# Patient Record
Sex: Female | Born: 1935 | Race: White | Hispanic: No
Health system: Southern US, Community
[De-identification: ages and names within clinical notes are randomized; demographics above are authoritative.]

---

## 2004-01-13 ENCOUNTER — Ambulatory Visit: Payer: Self-pay | Admitting: Family Medicine

## 2004-03-07 ENCOUNTER — Ambulatory Visit: Payer: Self-pay | Admitting: Family Medicine

## 2004-03-14 ENCOUNTER — Ambulatory Visit: Payer: Self-pay | Admitting: Family Medicine

## 2004-05-04 ENCOUNTER — Ambulatory Visit: Payer: Self-pay | Admitting: Family Medicine

## 2004-05-26 ENCOUNTER — Emergency Department: Payer: Self-pay | Admitting: Emergency Medicine

## 2005-02-04 ENCOUNTER — Emergency Department: Payer: Self-pay | Admitting: Emergency Medicine

## 2006-02-05 ENCOUNTER — Other Ambulatory Visit: Payer: Self-pay

## 2006-02-05 ENCOUNTER — Inpatient Hospital Stay: Payer: Self-pay | Admitting: Specialist

## 2006-02-13 ENCOUNTER — Emergency Department: Payer: Self-pay | Admitting: Emergency Medicine

## 2006-04-13 ENCOUNTER — Ambulatory Visit: Payer: Self-pay | Admitting: Family Medicine

## 2006-04-20 ENCOUNTER — Ambulatory Visit: Payer: Self-pay | Admitting: Family Medicine

## 2006-04-26 ENCOUNTER — Ambulatory Visit: Payer: Self-pay | Admitting: Family Medicine

## 2006-05-03 ENCOUNTER — Ambulatory Visit: Payer: Self-pay | Admitting: Family Medicine

## 2006-05-18 ENCOUNTER — Ambulatory Visit: Payer: Self-pay | Admitting: Family Medicine

## 2006-05-18 LAB — CONVERTED CEMR LAB
ALT: 12 units/L (ref 0–35)
AST: 17 units/L (ref 0–37)
Basophils Absolute: 0.1 10*3/uL (ref 0.0–0.1)
CO2: 23 meq/L (ref 19–32)
Chloride: 103 meq/L (ref 96–112)
Glucose, Bld: 100 mg/dL — ABNORMAL HIGH (ref 70–99)
HCT: 41 % (ref 36.0–46.0)
Lymphocytes Relative: 44 % (ref 12–46)
Lymphs Abs: 4 10*3/uL — ABNORMAL HIGH (ref 0.7–3.3)
Neutro Abs: 3.9 10*3/uL (ref 1.7–7.7)
Neutrophils Relative %: 43 % (ref 43–77)
Platelets: 280 10*3/uL (ref 150–400)
Potassium: 4.2 meq/L (ref 3.5–5.3)
RDW: 14.6 % — ABNORMAL HIGH (ref 11.5–14.0)
Sodium: 140 meq/L (ref 135–145)
TSH: 1.653 microintl units/mL (ref 0.350–5.50)
WBC: 9.2 10*3/uL (ref 4.0–10.5)

## 2006-06-14 ENCOUNTER — Ambulatory Visit: Payer: Self-pay | Admitting: Family Medicine

## 2006-06-14 DIAGNOSIS — I82409 Acute embolism and thrombosis of unspecified deep veins of unspecified lower extremity: Secondary | ICD-10-CM | POA: Insufficient documentation

## 2006-06-14 LAB — CONVERTED CEMR LAB: INR: 2.8

## 2006-07-12 ENCOUNTER — Ambulatory Visit: Payer: Self-pay | Admitting: Family Medicine

## 2006-08-09 ENCOUNTER — Ambulatory Visit: Payer: Self-pay | Admitting: Family Medicine

## 2006-08-09 LAB — CONVERTED CEMR LAB: Prothrombin Time: 13.4 s

## 2006-08-16 ENCOUNTER — Ambulatory Visit: Payer: Self-pay | Admitting: Family Medicine

## 2006-08-16 LAB — CONVERTED CEMR LAB: INR: 1.4

## 2006-08-31 ENCOUNTER — Ambulatory Visit: Payer: Self-pay | Admitting: Family Medicine

## 2006-08-31 LAB — CONVERTED CEMR LAB
INR: 3.7
Prothrombin Time: 23.2 s

## 2006-09-14 ENCOUNTER — Ambulatory Visit: Payer: Self-pay | Admitting: Family Medicine

## 2006-09-14 LAB — CONVERTED CEMR LAB: Prothrombin Time: 24 s

## 2006-09-28 ENCOUNTER — Ambulatory Visit: Payer: Self-pay | Admitting: Family Medicine

## 2006-09-28 LAB — CONVERTED CEMR LAB: Prothrombin Time: 24.6 s

## 2006-10-12 ENCOUNTER — Ambulatory Visit: Payer: Self-pay | Admitting: Family Medicine

## 2006-10-12 LAB — CONVERTED CEMR LAB: INR: 3.9

## 2006-11-01 ENCOUNTER — Ambulatory Visit: Payer: Self-pay | Admitting: Internal Medicine

## 2006-11-01 LAB — CONVERTED CEMR LAB: Prothrombin Time: 18.7 s

## 2006-11-29 ENCOUNTER — Ambulatory Visit: Payer: Self-pay | Admitting: Family Medicine

## 2006-11-29 LAB — CONVERTED CEMR LAB
INR: 1.2
Prothrombin Time: 13.7 s

## 2007-01-07 ENCOUNTER — Ambulatory Visit: Payer: Self-pay | Admitting: Family Medicine

## 2007-01-07 LAB — CONVERTED CEMR LAB: Prothrombin Time: 21.6 s

## 2007-02-22 ENCOUNTER — Ambulatory Visit: Payer: Self-pay | Admitting: Family Medicine

## 2007-02-22 LAB — CONVERTED CEMR LAB
INR: 2.8
Prothrombin Time: 20.2 s

## 2007-04-19 ENCOUNTER — Ambulatory Visit: Payer: Self-pay | Admitting: Family Medicine

## 2007-04-19 LAB — CONVERTED CEMR LAB: Prothrombin Time: 33.8 s

## 2007-04-20 LAB — CONVERTED CEMR LAB: Prothrombin Time: 52.2 s — ABNORMAL HIGH (ref 11.6–15.2)

## 2007-05-17 ENCOUNTER — Encounter (INDEPENDENT_AMBULATORY_CARE_PROVIDER_SITE_OTHER): Payer: Self-pay | Admitting: *Deleted

## 2007-06-07 ENCOUNTER — Encounter (INDEPENDENT_AMBULATORY_CARE_PROVIDER_SITE_OTHER): Payer: Self-pay | Admitting: *Deleted

## 2008-07-30 ENCOUNTER — Telehealth: Payer: Self-pay | Admitting: Family Medicine

## 2010-09-20 ENCOUNTER — Ambulatory Visit: Payer: Self-pay | Admitting: Ophthalmology

## 2010-10-04 ENCOUNTER — Ambulatory Visit: Payer: Self-pay | Admitting: Ophthalmology

## 2011-05-22 ENCOUNTER — Emergency Department: Payer: Self-pay | Admitting: Emergency Medicine

## 2011-10-11 ENCOUNTER — Ambulatory Visit: Payer: Self-pay | Admitting: Ophthalmology

## 2011-10-24 ENCOUNTER — Ambulatory Visit: Payer: Self-pay | Admitting: Ophthalmology

## 2011-10-29 ENCOUNTER — Inpatient Hospital Stay: Payer: Self-pay | Admitting: Internal Medicine

## 2011-10-29 LAB — CBC
HCT: 39.1 % (ref 35.0–47.0)
MCH: 30.9 pg (ref 26.0–34.0)
MCHC: 35.3 g/dL (ref 32.0–36.0)
MCV: 87 fL (ref 80–100)
Platelet: 273 10*3/uL (ref 150–440)
RDW: 13.8 % (ref 11.5–14.5)
WBC: 4.6 10*3/uL (ref 3.6–11.0)

## 2011-10-29 LAB — URINALYSIS, COMPLETE
Bacteria: NONE SEEN
Bilirubin,UR: NEGATIVE
Glucose,UR: NEGATIVE mg/dL (ref 0–75)
Protein: NEGATIVE
RBC,UR: 1 /HPF (ref 0–5)
Squamous Epithelial: NONE SEEN
WBC UR: NONE SEEN /HPF (ref 0–5)

## 2011-10-29 LAB — COMPREHENSIVE METABOLIC PANEL
BUN: 17 mg/dL (ref 7–18)
Bilirubin,Total: 1 mg/dL (ref 0.2–1.0)
Chloride: 94 mmol/L — ABNORMAL LOW (ref 98–107)
Co2: 25 mmol/L (ref 21–32)
Creatinine: 0.65 mg/dL (ref 0.60–1.30)
EGFR (Non-African Amer.): >60
Potassium: 3.6 mmol/L (ref 3.5–5.1)
SGPT (ALT): 12 U/L (ref 12–78)
Sodium: 128 mmol/L — ABNORMAL LOW (ref 136–145)
Total Protein: 6.8 g/dL (ref 6.4–8.2)

## 2011-10-29 LAB — MAGNESIUM: Magnesium: 1.4 mg/dL — ABNORMAL LOW

## 2011-10-29 LAB — CK TOTAL AND CKMB (NOT AT ARMC)
CK, Total: 33 U/L (ref 21–215)
CK-MB: 2.7 ng/mL (ref 0.5–3.6)
CK-MB: 1.6 ng/mL (ref 0.5–3.6)

## 2011-10-29 LAB — PROTIME-INR
INR: 1
Prothrombin Time: 13.3 secs (ref 11.5–14.7)

## 2011-10-29 LAB — TSH: Thyroid Stimulating Horm: 0.92 u[IU]/mL

## 2011-10-30 LAB — CBC WITH DIFFERENTIAL/PLATELET
Basophil #: 0 10*3/uL (ref 0.0–0.1)
Eosinophil #: 0 10*3/uL (ref 0.0–0.7)
Eosinophil %: 0.1 %
Lymphocyte #: 0.8 10*3/uL — ABNORMAL LOW (ref 1.0–3.6)
Lymphocyte %: 14.3 %
MCH: 30.5 pg (ref 26.0–34.0)
MCV: 88 fL (ref 80–100)
Monocyte #: 0.2 x10 3/mm (ref 0.2–0.9)
Monocyte %: 4.2 %
Neutrophil %: 81.2 %
Platelet: 240 10*3/uL (ref 150–440)
RBC: 4.16 10*6/uL (ref 3.80–5.20)
RDW: 13.7 % (ref 11.5–14.5)

## 2011-10-30 LAB — TROPONIN I: Troponin-I: 0.06 ng/mL — ABNORMAL HIGH

## 2011-10-30 LAB — BASIC METABOLIC PANEL
Anion Gap: 8 (ref 7–16)
Calcium, Total: 7.7 mg/dL — ABNORMAL LOW (ref 8.5–10.1)
Creatinine: 0.66 mg/dL (ref 0.60–1.30)
EGFR (Non-African Amer.): >60
Glucose: 120 mg/dL — ABNORMAL HIGH (ref 65–99)
Osmolality: 262 (ref 275–301)
Potassium: 4.6 mmol/L (ref 3.5–5.1)

## 2011-10-30 LAB — CK TOTAL AND CKMB (NOT AT ARMC)
CK, Total: 33 U/L (ref 21–215)
CK-MB: 1.5 ng/mL (ref 0.5–3.6)

## 2011-10-31 LAB — BASIC METABOLIC PANEL
Anion Gap: 10 (ref 7–16)
Calcium, Total: 7.8 mg/dL — ABNORMAL LOW (ref 8.5–10.1)
Chloride: 98 mmol/L (ref 98–107)
Co2: 20 mmol/L — ABNORMAL LOW (ref 21–32)
Osmolality: 261 (ref 275–301)
Potassium: 4.3 mmol/L (ref 3.5–5.1)
Sodium: 128 mmol/L — ABNORMAL LOW (ref 136–145)

## 2011-10-31 LAB — CBC WITH DIFFERENTIAL/PLATELET
Eosinophil #: 0 10*3/uL (ref 0.0–0.7)
Eosinophil %: 0.1 %
HCT: 35.1 % (ref 35.0–47.0)
HGB: 12.3 g/dL (ref 12.0–16.0)
Lymphocyte #: 0.6 10*3/uL — ABNORMAL LOW (ref 1.0–3.6)
Lymphocyte %: 4.7 %
MCV: 89 fL (ref 80–100)
Monocyte %: 2.5 %
Neutrophil #: 11.7 10*3/uL — ABNORMAL HIGH (ref 1.4–6.5)
RBC: 3.97 10*6/uL (ref 3.80–5.20)
WBC: 12.7 10*3/uL — ABNORMAL HIGH (ref 3.6–11.0)

## 2011-11-01 ENCOUNTER — Ambulatory Visit: Payer: Self-pay | Admitting: Internal Medicine

## 2011-11-01 LAB — CBC WITH DIFFERENTIAL/PLATELET
Basophil %: 0.1 %
Eosinophil #: 0 10*3/uL (ref 0.0–0.7)
Eosinophil %: 0.1 %
HCT: 34.6 % — ABNORMAL LOW (ref 35.0–47.0)
Lymphocyte #: 0.8 10*3/uL — ABNORMAL LOW (ref 1.0–3.6)
MCV: 89 fL (ref 80–100)
Monocyte %: 4.6 %
Neutrophil #: 11.6 10*3/uL — ABNORMAL HIGH (ref 1.4–6.5)
Neutrophil %: 89.4 %
RBC: 3.9 10*6/uL (ref 3.80–5.20)
WBC: 13 10*3/uL — ABNORMAL HIGH (ref 3.6–11.0)

## 2011-11-01 LAB — BASIC METABOLIC PANEL
Anion Gap: 13 (ref 7–16)
BUN: 24 mg/dL — ABNORMAL HIGH (ref 7–18)
Calcium, Total: 7.7 mg/dL — ABNORMAL LOW (ref 8.5–10.1)
Co2: 17 mmol/L — ABNORMAL LOW (ref 21–32)
Creatinine: 0.48 mg/dL — ABNORMAL LOW (ref 0.60–1.30)
EGFR (African American): >60
EGFR (Non-African Amer.): >60
Potassium: 4 mmol/L (ref 3.5–5.1)
Sodium: 135 mmol/L — ABNORMAL LOW (ref 136–145)

## 2011-11-14 ENCOUNTER — Ambulatory Visit: Payer: Self-pay | Admitting: Internal Medicine

## 2011-11-14 DEATH — deceased

## 2014-01-07 IMAGING — CT CT ABD-PELV W/ CM
1 of 2 series · 14 of 32 positions shown, 18 images · IV contrast (isovue)
Comparison: none

REASON FOR EXAM: (1) diffuse abdominal pain, elevated lactic acid, no PO
contrast please; (2) dif
COMMENTS:

PROCEDURE:     CT  - CT ABDOMEN / PELVIS  W  - October 29, 2011 [DATE]
RESULT:     Comparison:  None
TECHNIQUE: Multiple axial images of the abdomen and pelvis were performed
from the lung bases to the pubic symphysis, without p.o. contrast and with
100 mL of Isovue 300 intravenous contrast.

[Series 2: soft tissue · axial · 0.75mm/px · z∈[-432,-42]mm · 14 of 142 slices shown, 18 images]
[im 6/142  soft-tissue]
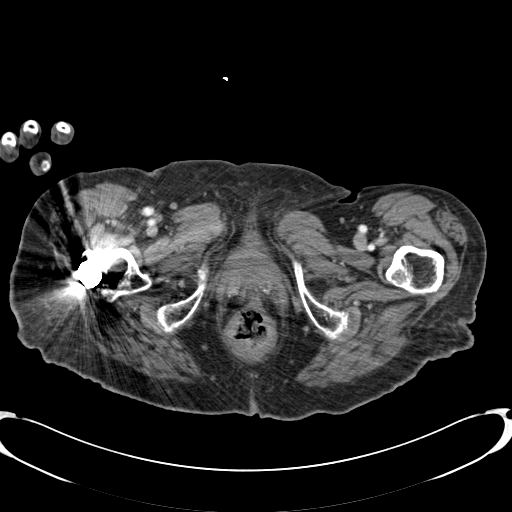
[im 6/142  bone]
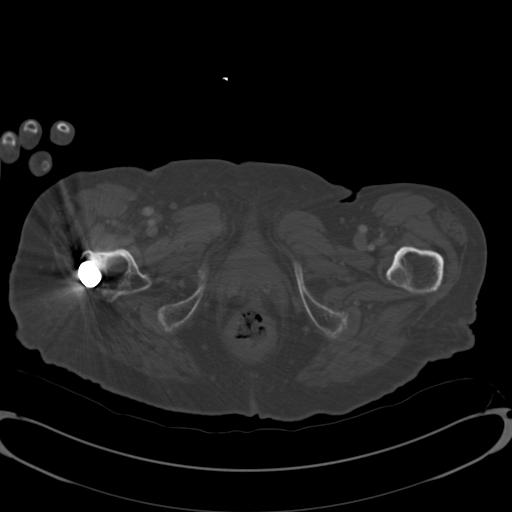
[im 18/142  soft-tissue]
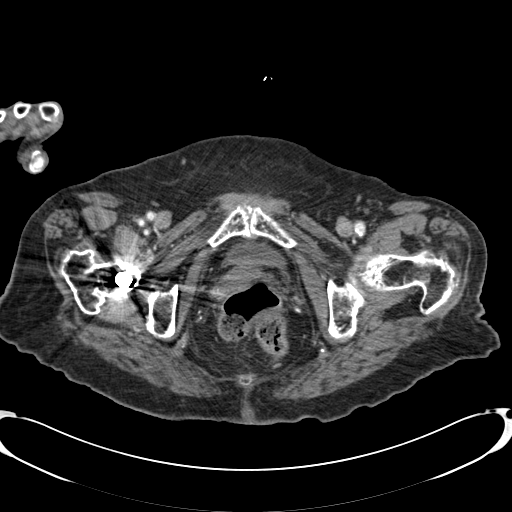
[im 30/142  soft-tissue]
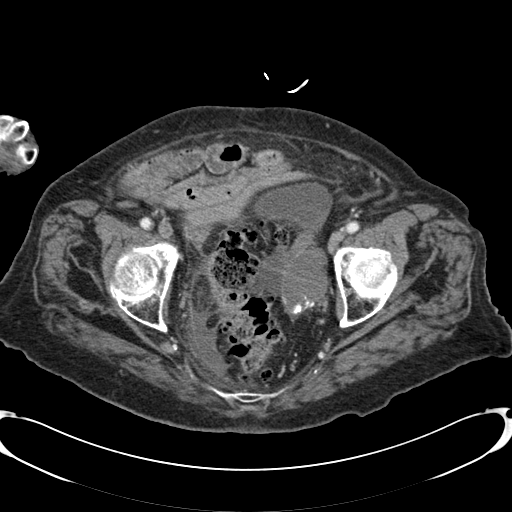
[im 42/142  soft-tissue]
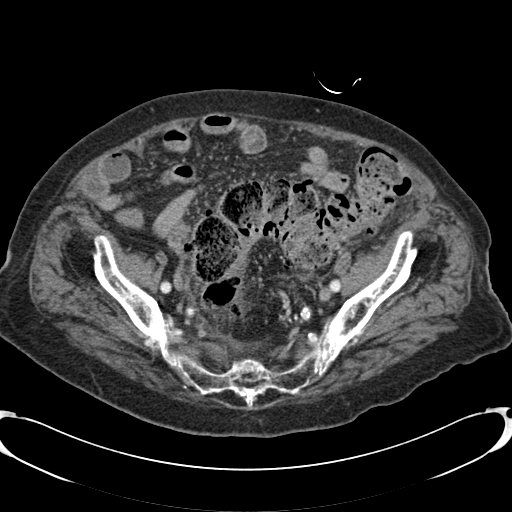
[im 53/142  soft-tissue]
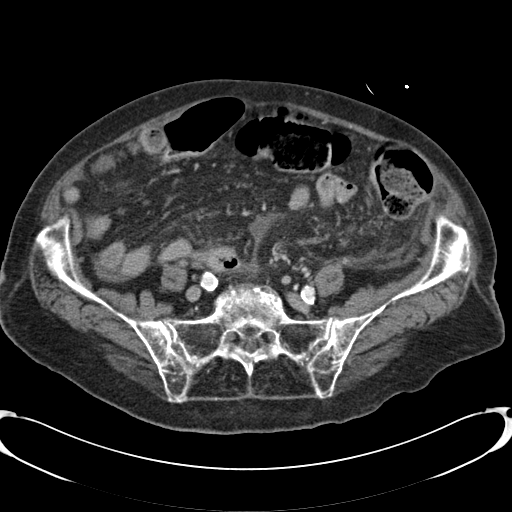
[im 65/142  soft-tissue]
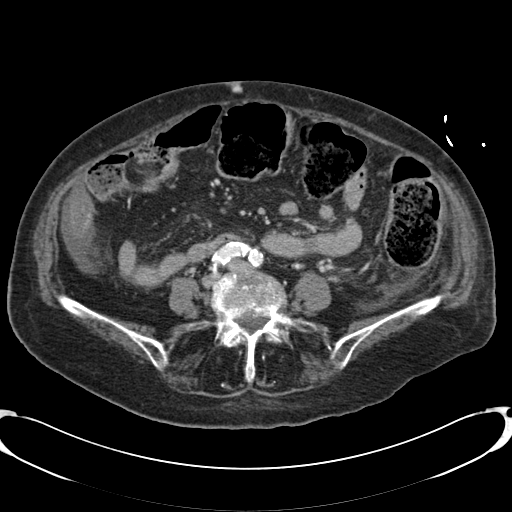
[im 77/142  soft-tissue]
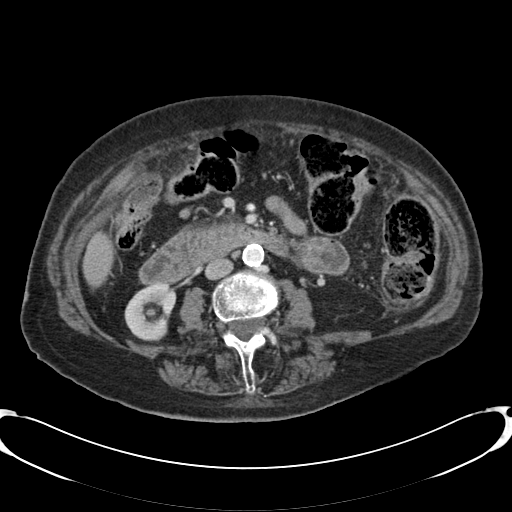
[im 89/142  soft-tissue]
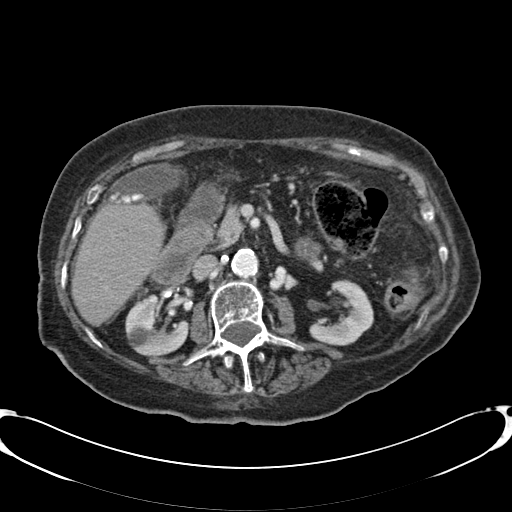
[im 100/142  soft-tissue]
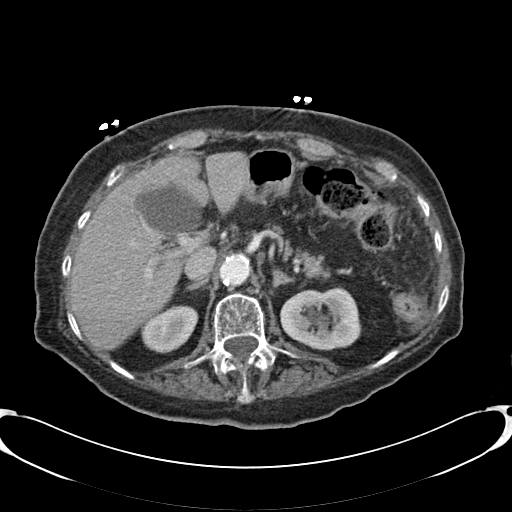
[im 100/142  bone]
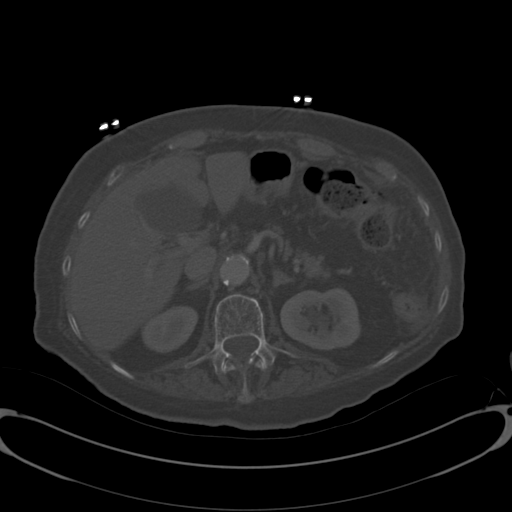
[im 112/142  soft-tissue]
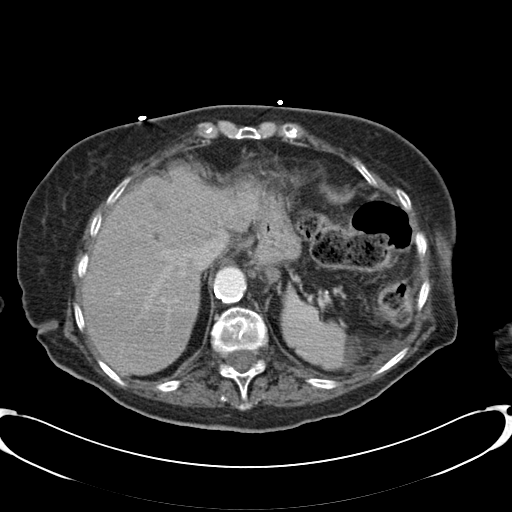
[im 118/142  lung]
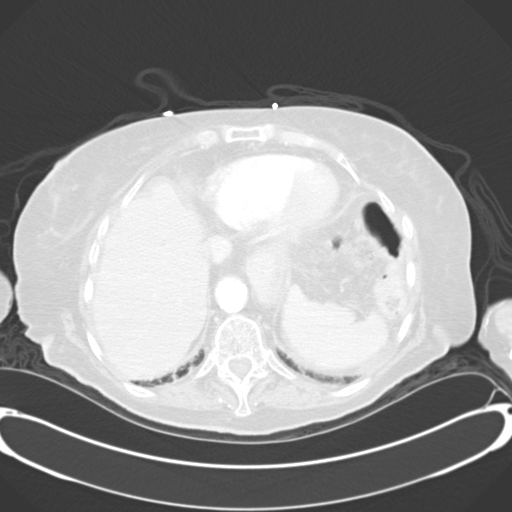
[im 124/142  soft-tissue]
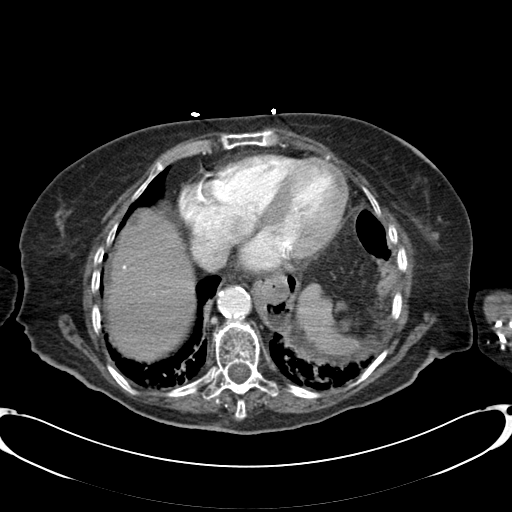
[im 124/142  lung]
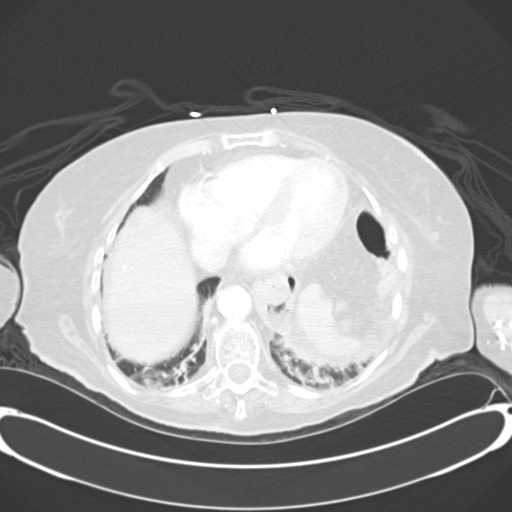
[im 130/142  lung]
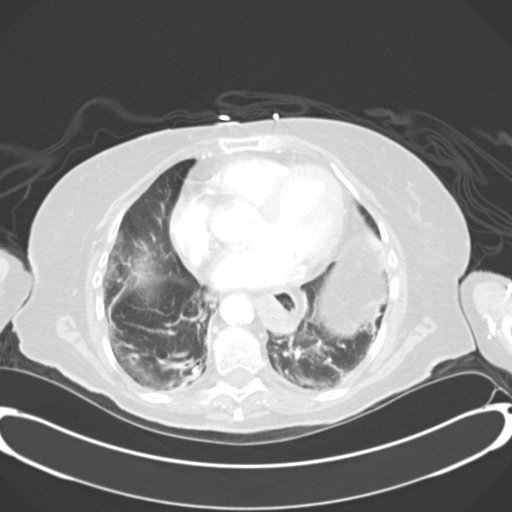
[im 136/142  soft-tissue]
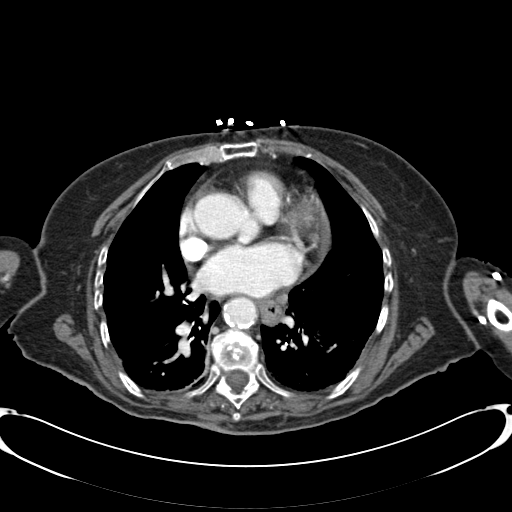
[im 136/142  lung]
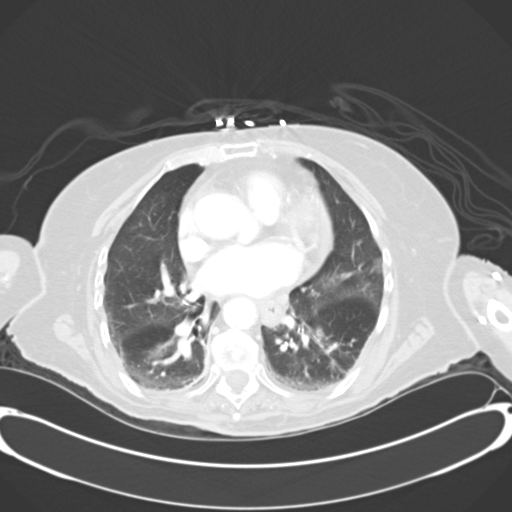

[14 of 32 positions shown; findings below may reference images not displayed]

FINDINGS: Mild basilar opacities are likely secondary to atelectasis. There is a
moderate sized hiatal hernia. Calcifications are seen in the coronary
arteries.

Low-attenuation mass in the liver is consistent with a cyst. Small
calcifications in the liver are likely sequela of old prior infection. There
is mild central intrahepatic biliary ductal dilatation, which is
nonspecific. The left hepatic lobe is somewhat atrophic. There is a small
low-attenuation lesion in the left hepatic lobe which is too small to
characterize. Multiple calcified stones are present within the gallbladder.
The spleen, adrenals, and pancreas are unremarkable. There is an 8mm
calcification medial to the right hilum which may represent a calcified
renal artery aneurysm. There is a cyst in the right kidney. Other small
low-attenuation lesion in the right kidney is too small to characterize. No
hydronephrosis.

The small and large bowel are normal in caliber. The appendix is not
visualized. However, there are no inflammatory changes in the right lower
quadrant. There is a small amount of free fluid and stranding throughout the
abdomen and pelvis. There is mild diverticulosis of the sigmoid and
descending colon. Stool is seen in the sigmoid colon. Mild apparent wall
thickening of the superior descending colon is likely related to
underdistention. However, there is some adjacent free fluid. Correlate for
colitis. There is a low-attenuation mass in the left adnexa with
calcification the posterior aspect of the mass. It measures 5.7 x 3.7 cm.
This may be ovarian.

There is an old fracture of the right inferior pubic ramus.

No aggressive lytic or sclerotic osseous lesions are identified.
IMPRESSION: 1. There is a mild amount of free fluid and stranding throughout the abdomen
and pelvis. This is nonspecific. There is an area of apparent wall
thickening of the superior descending colon which is likely secondary to
underdistention. However, given the adjacent free fluid, an etiology such as
colitis cannot be excluded. Correlate clinically.
2. There is a moderate sized low-attenuation mass with some calcification in
the left adnexa. This may be ovarian. In a postmenopausal patient,
differential would include benign and malignant cystic ovarian neoplasms.
Nonemergent pelvic ultrasound and gynecology consultation are suggested.
3. Mild central intrahepatic biliary ductal dilatation is nonspecific.
Correlate with laboratory values. Further evaluation could be provided with
M.R.C.P. or ERCP, as indicated.
4. Cholelithiasis.

## 2014-01-08 IMAGING — CT CT ABD-PELV W/O CM
1 of 2 series · 14 of 32 positions shown, 18 images · non-contrast
Comparison: none

REASON FOR EXAM: (1) f/u colitis, exam worsening, [AGE]; (2) same;
NOTE: Nursing to Give Oral
COMMENTS:

PROCEDURE:     CT  - CT ABDOMEN AND PELVIS W[DATE]  [DATE]
RESULT:     CT abdomen pelvis dated 10/30/2011. Comparison made to prior
study dated 10/29/2011.
TECHNIQUE: Helical non IV contrasted 3 mm sections were obtained from the
lung bases through the pubic symphysis. The patient received oral contrast.

[Series 2: soft tissue · axial · 0.81mm/px · z∈[-268,+146]mm · 14 of 152 slices shown, 18 images]
[im 7/152  soft-tissue]
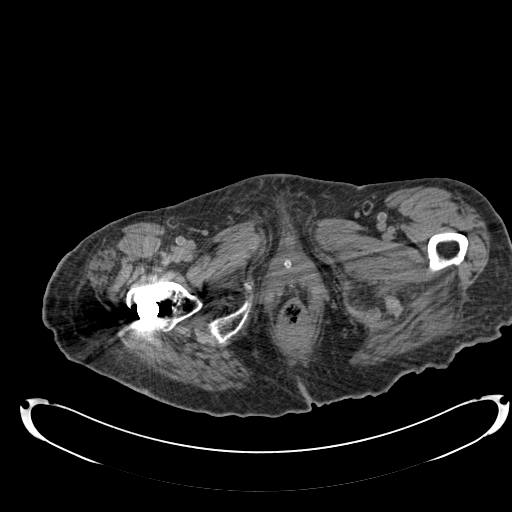
[im 7/152  bone]
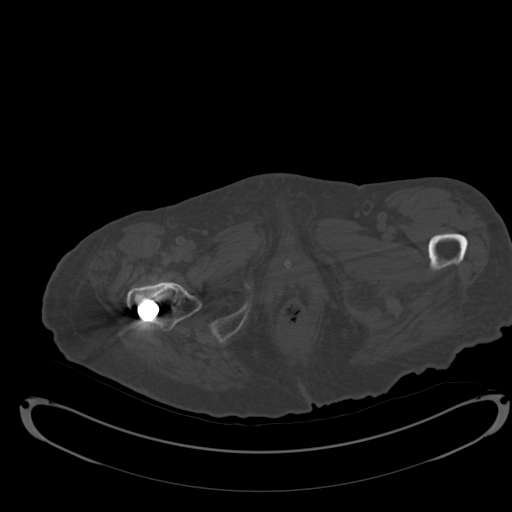
[im 19/152  soft-tissue]
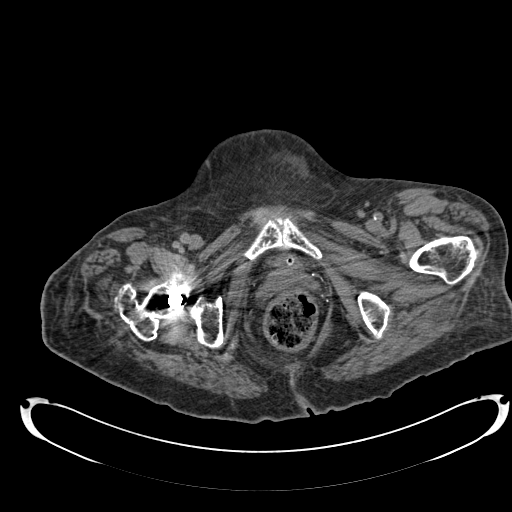
[im 32/152  soft-tissue]
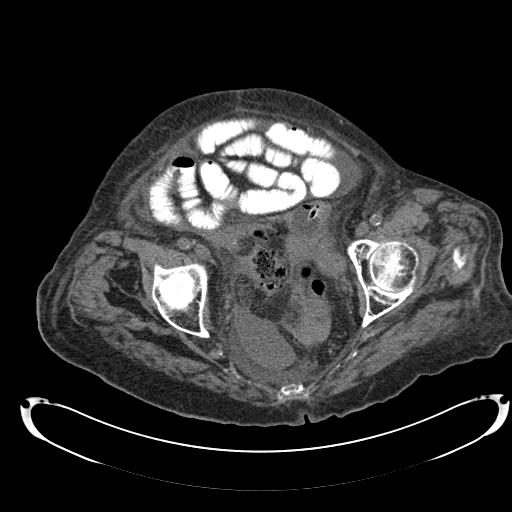
[im 45/152  soft-tissue]
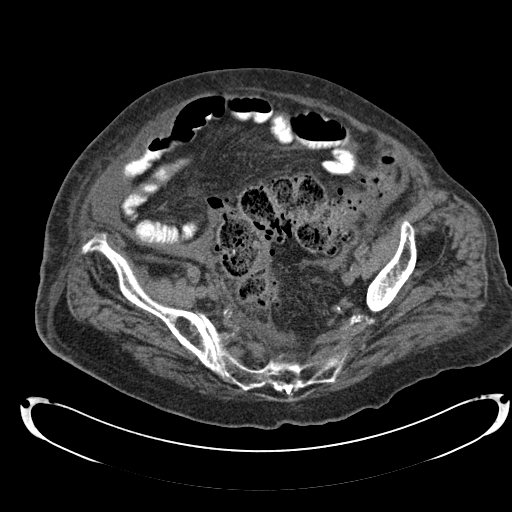
[im 57/152  soft-tissue]
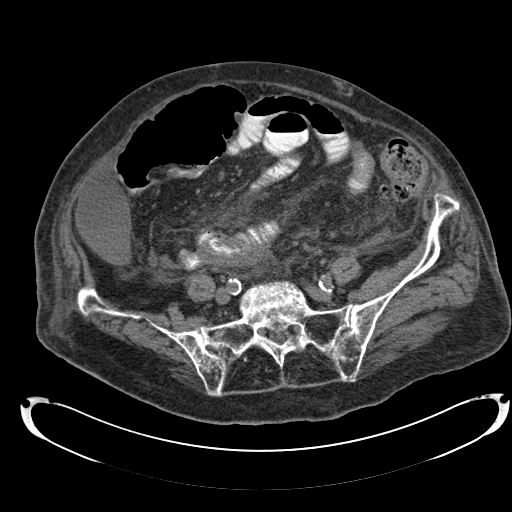
[im 70/152  soft-tissue]
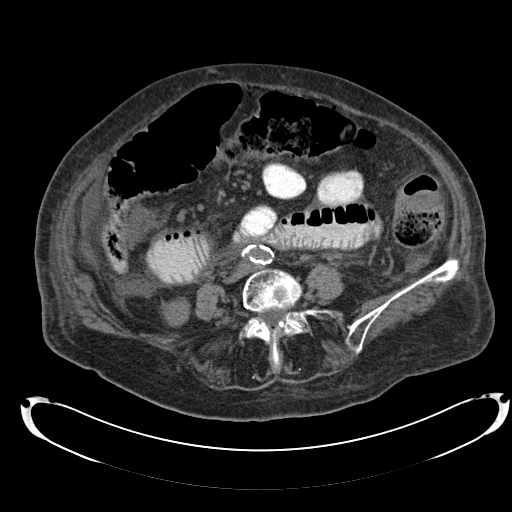
[im 82/152  soft-tissue]
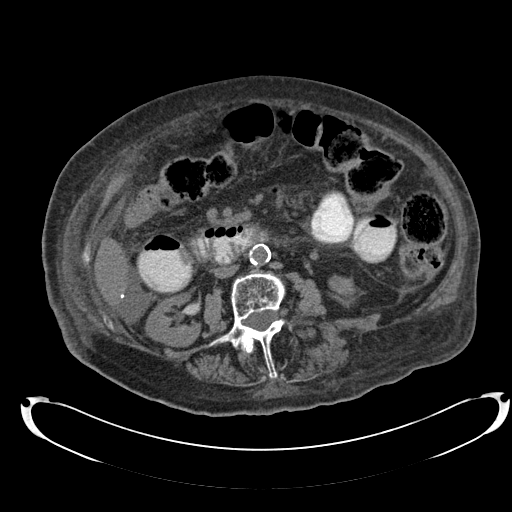
[im 95/152  soft-tissue]
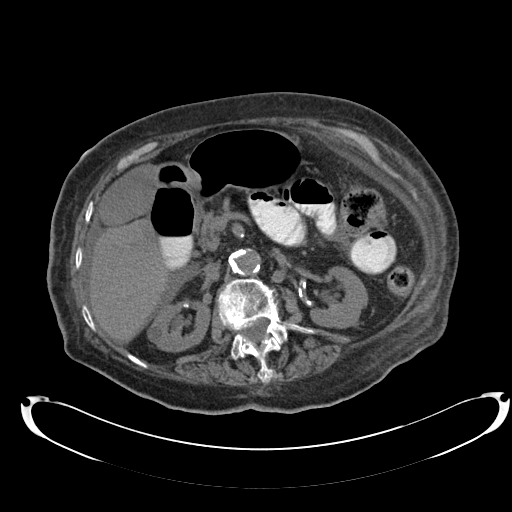
[im 107/152  soft-tissue]
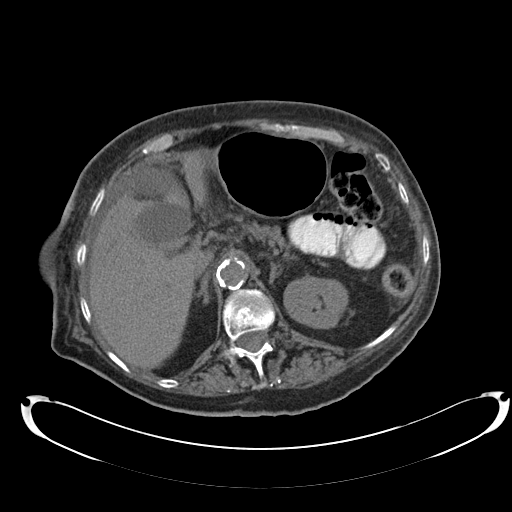
[im 107/152  bone]
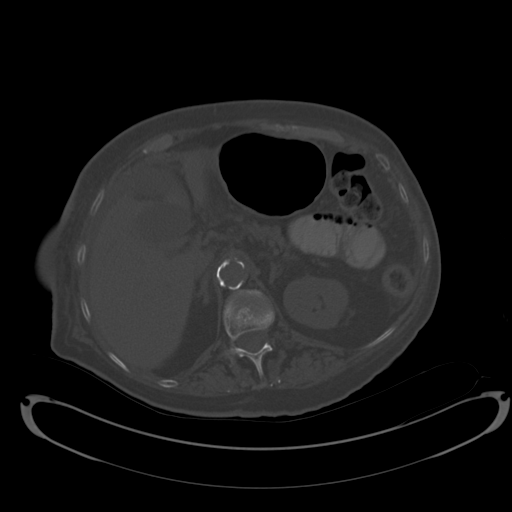
[im 120/152  soft-tissue]
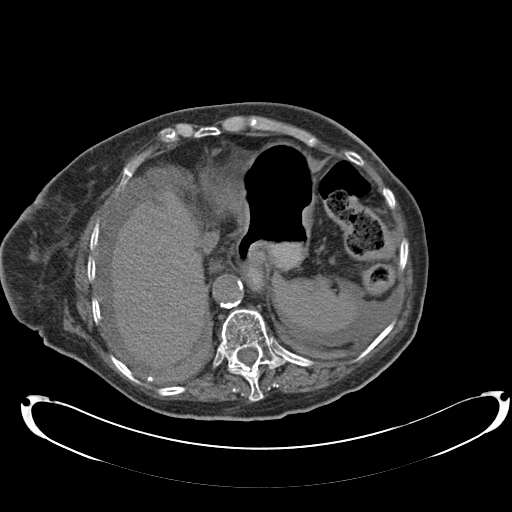
[im 126/152  lung]
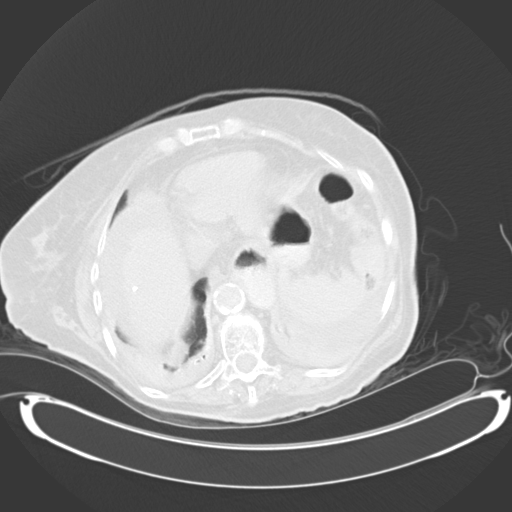
[im 133/152  soft-tissue]
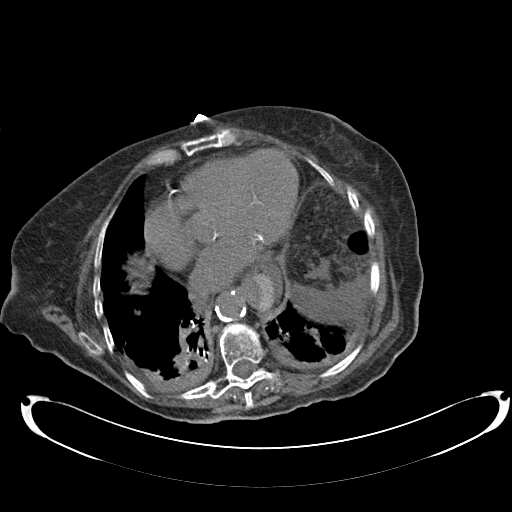
[im 133/152  lung]
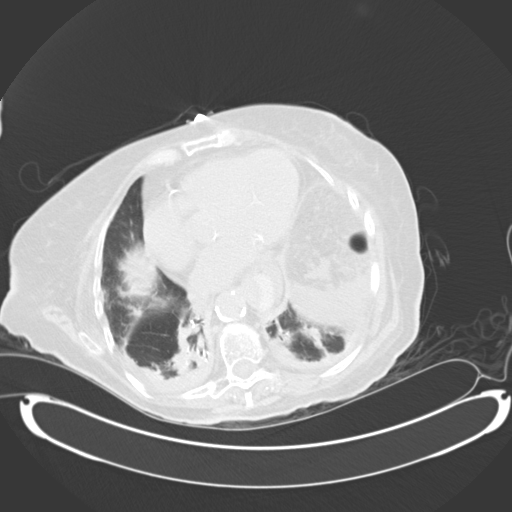
[im 139/152  lung]
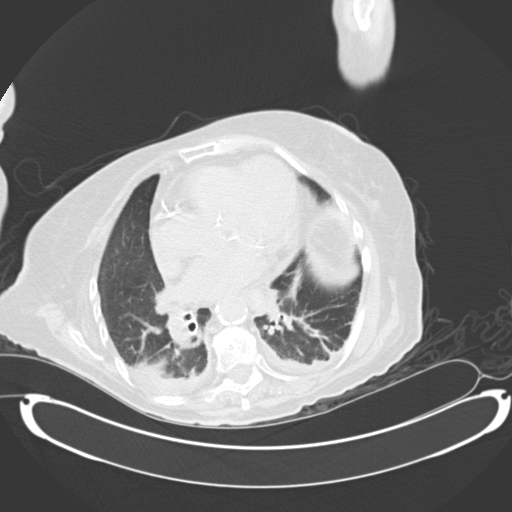
[im 145/152  soft-tissue]
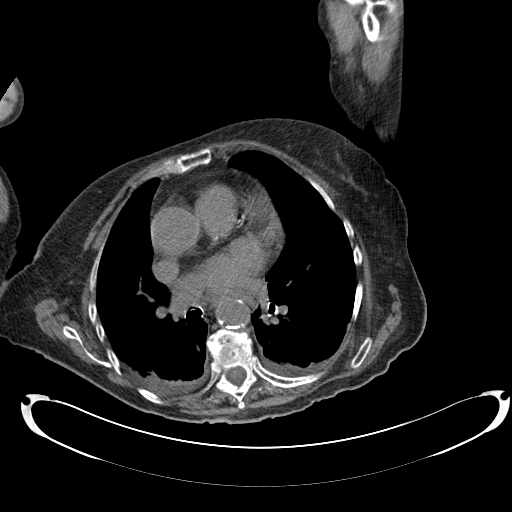
[im 145/152  lung]
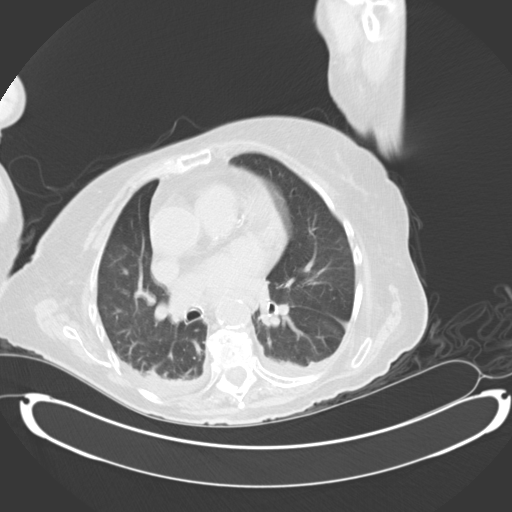

[14 of 32 positions shown; findings below may reference images not displayed]

FINDINGS: Small bilateral pleural effusion identified. Atelectasis versus
infiltrate appreciated within the lung bases, mild.

There has been interval development of a mild amount of perihepatic and
perisplenic ascites. Gallstones once again appreciated within the
gallbladder. A cystic area projects within the gallbladder fossa. This may
reflect the sequela of a choledochocyst. This appears to extend into the
region of the liver parenchyma and neck of the gallbladder.

There has been interval development of multiple dilated loops of small bowel
which contain contrast and air. Small amount of ascites is appreciated
within the lower abdomen and pelvis. Mild to moderately dilated loops of
small bowel are appreciated within the pannus along the anterior lower
abdomen. There is no evidence of drainable loculated fluid collections.
Small foci of pneumoperitoneum identified deep to the anterior abdominal
wall. This finding raises the suspicion of a perforated hollow viscus. These
findings are appreciated on images 84 to 93. This finding has developed in
the interim. Discrete foci of near is identified within the central and
lower portions of the abdomen . Diffuse diverticulosis is appreciated in
within the sigmoid colon and a ruptured diverticulum is also diagnostic
consideration.

A moderate to large amount of stools appreciated within the region of the
sigmoid colon. There is fluid within the pelvis with areas adjacent to the
sigmoid colon. Mild stranding is identified within the mesenteric fat of the
pelvis diverticulitis early or mild particularly within the sigmoid colon is
a diagnostic concern. The area of bowel wall thickening in the proximal
portion of the descending colon is unchanged and a focus of early or mild
colitis is also diagnostic consideration.

The kidneys are atrophic. Low attenuating focus is identified within the
right kidney possibly representing a cyst. Retained contrast identified
within the right renal pelvis. The spleen, adrenals, and pancreas are
unremarkable.
IMPRESSION: 1. Atelectasis versus infiltrate in the lung bases.
2. Small foci of pneumoperitoneum identified. This finding as occurred in
the interim different considerations are ruptured hollow viscus or possibly
a ruptured diverticulum. Free fluid is also appreciated within the abdomen
also occurring in the interim again raising concern of a possible ruptured
hollow viscus.
3. Small bowel ileus versus early or mild partial obstruction
4. Diverticulosis and possibly diverticulitis within the sigmoid colon.
5. Early or mild focus of colitis within the proximal descending colon
6. Moderate to large amount of fecal retention
7. findings which may represent a choledochocyst.
8. Gallstones within the gallbladder

## 2014-06-02 NOTE — Consult Note (Signed)
Chief Complaint:   Subjective/Chief Complaint Dr. Earmon Phoenixooper's and Bird's notes reviewed. Apparently, family has decided comfort care. Agree with current management. Further recommendations per primary and surgery teams.   Electronic Signatures: Lurline DelIftikhar, Doren Kaspar (MD)  (Signed 17-Sep-13 12:03)  Authored: Chief Complaint   Last Updated: 17-Sep-13 12:03 by Lurline DelIftikhar, Domonic Hiscox (MD)

## 2014-06-02 NOTE — Discharge Summary (Signed)
PATIENT NAME:  Tina Orr, Tina Orr MR#:  [id number] DATE OF BIRTH:  1911/02/01  DATE OF ADMISSION:  10/29/2011 DATE OF DISCHARGE:  11/01/2011  DIAGNOSES AT TIME OF DISCHARGE:  1. Abdominal pain with nausea, vomiting, and diarrhea. 2. Perforated abdominal viscus. 3. History of hypertension. 4. Osteoarthritis.   CHIEF COMPLAINT: Abdominal pain, nausea, vomiting, and diarrhea.   HISTORY OF PRESENT ILLNESS: Tina Orr is a 81 year old female who [facility] Emergency [facility] complaining of abdominal pain, nausea, vomiting, and diarrhea. Patient states that her symptoms started approximately two days back and subsequently became weak, was unable to get out of bed. She was noted to be dehydrated and tachycardic. Initial CT scan was suggestive of colitis.   PAST MEDICAL HISTORY: Significant for hypertension.   ALLERGIES: She did not have any known drug allergies.   PHYSICAL EXAMINATION: GENERAL: She was in moderate distress. VITAL SIGNS: Temperature 98.4, pulse 92, respirations 22, blood pressure 122/46. HEENT: Normocephalic, atraumatic. Sclera was anicteric. NECK: Supple. HEART: S1, S2, tachycardic. LUNGS: Clear to auscultation bilaterally. ABDOMEN: Soft, distended. Initially bowel sounds were hyperactive, subsequently, she did not have any bowel sounds. EXTREMITIES: No edema. NEUROLOGIC: Nonfocal.   LABORATORY, DIAGNOSTIC AND RADIOLOGICAL DATA: Glucose 184, BUN 17, creatinine 0.6, sodium 128, potassium 3.6, chloride 94, bicarbonate 25, magnesium 1.4. LFTs were within normal range. Troponin 0.07. TSH 0.9, WBC count 4.6, hemoglobin 13.8, lactic acid was elevated at 2.2, platelet 273.   HOSPITAL COURSE: Patient was admitted to [facility] and received intravenous fluids. She also received Zofran for nausea, vomiting. She was subsequently evaluated by surgeons, Dr. Egbert GaribaldiBird and also Dr. Excell Seltzerooper. She was also seen by GI, Dr. Niel HummerIftikhar. A repeat CT scan was  performed which showed atelectasis versus infiltrate in the lung bases. A small foci of pneumoperitoneum was identified. Considerations involve ruptured hollow viscus, possibly ruptured diverticulum. Free fluid was also appreciated. Small bowel ileus versus early to mild partial obstruction, diverticulosis and possible diverticulitis within the sigmoid colon. Patient was started on intravenous antibiotics. During her stay in the hospital she continued to be symptomatic with pain and consultations were held with family and it was clear that she was not a good surgical candidate with her advanced age and subsequent need for ileostomy and prolonged postoperative course. She was therefore seen by palliative care and was subsequently transferred to hospice home for pain management and comfort care. Family was made aware of her overall poor prognosis and they were in agreement with the plan and course of action. Total time spent in discharging patient and co rodinating care was 45 minutes  ____________________________ Barbette ReichmannVishwanath Icess Bertoni, MD vh:cms D: 05/17/11 13:26:04 ET T: 11/03/2011 11:16:35 ET JOB#: [id number]  cc: Barbette ReichmannVishwanath Matraca Hunkins, MD, <Dictator> Barbette ReichmannVISHWANATH Debar Plate MD ELECTRONICALLY SIGNED 11/06/2011 13:08

## 2014-06-02 NOTE — Consult Note (Signed)
PATIENT NAME:  Tina Orr, [name] MR#:  [id number] DATE OF BIRTH:  1910-05-17  DATE OF CONSULTATION:  10/30/2011  REFERRING PHYSICIAN:  Barbette ReichmannVishwanath Hande, MD / Lurline DelShaukat Iftikhar, MD CONSULTING PHYSICIAN:  Redge GainerMark A. Egbert GaribaldiBird, MD  IMPRESSION: Worsening abdominal pain and abdominal distention. No signs of acute peritonitis at this point. No clear signs of mesenteric ischemia. Possible diverticulitis.   RECOMMENDATIONS: Repeat CT scan later tonight with aggressive hydration.   HISTORY: This is a 81 year old female admitted to the hospital with abdominal pain, nausea, vomiting, and diarrhea. The patient was noted to be severely, dehydrated and tachycardic and CT scan was obtained in the Emergency Room which demonstrated signs of colitis. There was a small amount of free fluid. The patient was admitted to the medical service. Over the course of her hospitalization, she has had worsening abdominal pain and distention. Gastroenterology became involved earlier this morning. She had low-grade fever to 100.7. Surgical services were contacted by gastroenterology for our evaluation.   PAST MEDICAL HISTORY: Well-defined in the chart and includes advanced age and hypertension.   PAST SURGICAL HISTORY: None.  SOCIAL HISTORY: The patient has several children, is widowed.   MEDICATIONS: Aspirin, Lotrel, Benadryl, and tramadol as needed.   FAMILY HISTORY: Negative for coronary artery disease and diabetes.   SOCIAL HISTORY: No smoking. No alcohol use. No drug use. Lives in a group home, assisted living.   DRUG ALLERGIES: No known drug allergies.   PHYSICAL EXAMINATION:   GENERAL: The patient is alert and oriented, somewhat hard of hearing. Family is at bedside.   RESPIRATORY: Lungs are clear.   CARDIOVASCULAR: Heart with regular rate and rhythm.   VITAL SIGNS: Temperature 97.7, pulse 85, blood pressure 118/81, and room air saturation is 96% on room air.   ABDOMEN: Distended and tender throughout but no obvious  peritoneal signs. No obvious hernias.   EXTREMITIES: Warm and well-perfused.   NEUROLOGIC/PSYCHIATRIC: Normal.   RESULTS: Glucose 120, sodium 129, and potassium 4.6. Troponin is normal. White count 5.3, hemoglobin 12.7, hematocrit 12.7, and platelet count 240,000.   Review of CT scan is as described above.   IMPRESSION: Abdominal pain in a patient with advanced age. I suspect either colitis, constipation, or diverticulitis of the sigmoid colon. At this point, I do not see an acute indication for surgery.   ASSESSMENT AND PLAN: As described above.  ____________________________ Redge GainerMark A. Egbert GaribaldiBird, MD mab:slb D: 11/05/2011 15:23:00 ET     T: 11/05/2011 16:22:29 ET        JOB#: [id number] cc: Loraine LericheMark A. Egbert GaribaldiBird, MD, <Dictator> Barbette ReichmannVishwanath Hande, MD Lurline DelShaukat Iftikhar, MD Lanna Labella Kela MillinA Gregoria Selvy MD ELECTRONICALLY SIGNED 11/05/2011 17:31

## 2014-06-02 NOTE — Consult Note (Signed)
PATIENT NAME:  Tina Orr, Tina Orr MR#:  [id number] DATE OF BIRTH:  Aug 22, 1910  DATE OF CONSULTATION:  10/30/2011  REFERRING PHYSICIAN:  Barbette Reichmann, MD CONSULTING PHYSICIAN:  Tina Del, MD  REASON FOR CONSULTATION: Abdominal pain, nausea, and vomiting.   HISTORY OF PRESENT ILLNESS: The patient is a 81 year old female who lives in a group home at [facility]. She was transferred to the hospital last night with abdominal pain, nausea, vomiting and questionable diarrhea. The patient was evaluated early this morning. According to the patient she has been feeling bloated for the last couple of days. She took some laxative due to constipation after which she had some loose bowel movements but denies any frank diarrhea. The abdominal pain is diffuse and she has been vomiting for the last couple of days as well. The patient was admitted with the presumptive diagnosis of colitis. CT scan of the abdomen done in the emergency room did not show any significant small or large bowel abnormalities. Slight thickening of the wall of the descending colon was noted which could be due to distention. A small amount of free fluid was noted in that area. An adnexal mass was noted as well. Appendix was normal. Cholelithiasis was noted with mild biliary dilatation. LFTs are normal. The patient had a low-grade temperature of 100.7 on admission. The case was discussed with the patient's nurse yesterday evening and the patient appeared to be comfortable at that point. No vomiting was reported last night but she did vomit once this morning. The patient became afebrile after admission and has been afebrile since then. This morning on evaluation the patient is complaining of some nausea and diffuse abdominal pain. According to her, she had a small bowel movement last night. No other significant symptoms were reported by the patient. She would like to eat something even though she said she is not hungry.   PAST MEDICAL HISTORY:  According to chart, significant for hypertension.   ALLERGIES: None reported.   SOCIAL HISTORY: She does not smoke or drink. She lives at a residential facility.   FAMILY HISTORY: Not available.   MEDICATIONS: (At the residential facility). 1. Aspirin 81 mg a day. 2. Lotrel. 3. Benadryl. 4. Tramadol.   PHYSICAL EXAMINATION:   GENERAL: Elderly female who does not appear to be toxic or septic or in any acute distress.   VITAL SIGNS: Temperature is 97.4, heart rate is anywhere between 80 to 105, respirations are about 20, and blood pressure 112/56.   HEENT: Unremarkable. No jaundice was noted.   LUNGS: Grossly clear to auscultation bilaterally with fair air entry.   CARDIOVASCULAR: Regular rate and rhythm. No gallops or murmurs.   ABDOMEN: Distended abdomen. Bowel sounds are absent. Diffuse abdominal tenderness was noted mainly in the mid to lower abdominal area. There is no rebound. No hepatosplenomegaly or significant ascites was noted.   NEUROLOGIC: Examination appears to be unremarkable.   LABORATORY AND RADIOLOGIC DATA: White cell count is 5.3, hemoglobin 12.7, hematocrit 36.7, and platelet count 240. Troponin 0.06. BUN 17 and creatinine 0.66. The rest of the electrolytes are fairly unremarkable. Serum sodium is low at 129. TSH 0.92. Serum lipase is normal at 101. Lactic acid was elevated at 2.2 yesterday although it is normal at 1.20 today.   Three-way abdominal film shows nonobstructive bowel gas pattern and stool throughout the majority of the colon.   ASSESSMENT AND PLAN: The patient is with abdominal pain which may be simply due to constipation as reported by the patient  and suggested by the abdominal films. The absence of bowel sounds and diffuse abdominal tenderness raises concerns about possible ischemic event. Serum lactic acid was elevated yesterday although it is normal today. White cell count is normal and she remains afebrile, although she did have some low-grade  fever last night. I agree with current conservative management with intravenous fluids and intravenous antibiotics. The case has been discussed with Dr. Natale LayMark Bird of general surgery due to above-mentioned concerns and Dr. Egbert GaribaldiBird will evaluate the patient to rule out any acute intraabdominal process such as bowel obstruction or mesenteric ischemia. Further recommendations to follow and we will follow closely.  ____________________________ Tina DelShaukat Mariem Skolnick, MD si:slb D: 10/30/2011 11:51:01 ET T: 10/30/2011 12:03:25 ET JOB#: [id number]  cc: Tina DelShaukat Setsuko Robins, MD, <Dictator> Tina DelSHAUKAT Rayme Bui MD ELECTRONICALLY SIGNED 11/06/2011 12:36

## 2014-06-02 NOTE — Op Note (Signed)
PATIENT NAME:  Tina Orr, Tina Orr MR#:  [id number] DATE OF BIRTH:  May 24, 1910  DATE OF PROCEDURE:  10/24/2011  PREOPERATIVE DIAGNOSIS: Visually significant cataract of the left eye.   POSTOPERATIVE DIAGNOSIS: Visually significant cataract of the left eye.   OPERATIVE PROCEDURE: Cataract extraction by phacoemulsification with implant of intraocular lens to left eye.   SURGEON: Galen ManilaWilliam Alauna Hayden, MD.   ANESTHESIA:  1. Managed anesthesia care.  2. Topical tetracaine drops followed by 2% Xylocaine jelly applied in the preoperative holding area.   COMPLICATIONS: None.   TECHNIQUE:  Stop and chop.   DESCRIPTION OF PROCEDURE: The patient was examined and consented in the preoperative holding area where the aforementioned topical anesthesia was applied to the left eye and then brought back to the Operating Room where the left eye was prepped and draped in the usual sterile ophthalmic fashion and a lid speculum was placed. A paracentesis was created with the side port blade and the anterior chamber was filled with viscoelastic. A near clear corneal incision was performed with the steel keratome. A continuous curvilinear capsulorrhexis was performed with a cystotome followed by the capsulorrhexis forceps. Hydrodissection and hydrodelineation were carried out with BSS on a blunt cannula. The lens was removed in a stop and chop technique and the remaining cortical material was removed with the irrigation-aspiration handpiece. The capsular bag was inflated with viscoelastic and the Alcon SN60WF 21.5-diopter lens, serial number [id number] was placed in the capsular bag without complication. The remaining viscoelastic was removed from the eye with the irrigation-aspiration handpiece. The wounds were hydrated. The anterior chamber was flushed with Miostat and the eye was inflated to physiologic pressure. The wounds were found to be water tight. The eye was dressed with Vigamox. The patient was given protective glasses  to wear throughout the day and a shield with which to sleep tonight. The patient was also given drops with which to begin a drop regimen today and will follow-up with me in one day.   ____________________________ Jerilee FieldWilliam L. Dawt Reeb, MD wlp:cms D: 10/24/2011 17:16:52 ET T: 10/24/2011 17:36:10 ET JOB#: [id number]  cc: Bruna Dills L. Arlenne Kimbley, MD, <Dictator> Jerilee FieldWILLIAM L Eufelia Veno MD ELECTRONICALLY SIGNED 10/26/2011 13:13

## 2014-06-02 NOTE — Consult Note (Signed)
Comments   I met with pt's son and daughter-in-law. They understand that pt's prognosis is poor and they are in agreement with comfort care. We discussed transfer to the [place] and they are also in agreement with this. Donia Guiles Ward, RN, hospital liason for the [facility] notified. CM aware. Orders entered.   Electronic Signatures: Sher Shampine, Izora Gala (MD)  (Signed 18-Sep-13 11:17)  Authored: Palliative Care   Last Updated: 18-Sep-13 11:17 by Dayle Sherpa, Izora Gala (MD)

## 2014-06-02 NOTE — H&P (Signed)
PATIENT NAME:  Tina Orr, Tina Orr MR#:  [id number] DATE OF BIRTH:  06-23-1910  DATE OF ADMISSION:  10/29/2011  PRIMARY CARE PHYSICIAN: Dr. Marcello Fennel  CHIEF COMPLAINT:  Abdominal pain, nausea, vomiting, and diarrhea.   HISTORY OF PRESENT ILLNESS: This is a 81 year old female who comes from a group home at [facility] due to abdominal pain, nausea, vomiting, and diarrhea. The patient says the symptoms began about two days ago. She thought it would improve but has been progressively getting worse. She became increasingly weak and was unable to get out of bed today and therefore was brought to the ER. The patient was noted to be severely dehydrated, tachycardic, and also CT scan findings were suggestive of colitis, although the patient has had no diarrhea while being here in the Emergency Room. She also denies any nausea or vomiting presently. She does have significant abdominal pain which is generalized and diffuse in nature, radiating to the back. She also said that she has not been able to eat or drink anything for the past two days. Hospitalist service was contacted for further treatment and evaluation.   REVIEW OF SYSTEMS:  CONSTITUTIONAL: No documented fever. Positive weakness. No weight gain or weight loss. EYES: No blurred or double vision. ENT: No tinnitus. No postnasal drip. No redness of the oropharynx. RESPIRATORY: No cough, no wheeze, no hemoptysis, no dyspnea. CARDIOVASCULAR: No chest pain, no orthopnea, no palpitations, no syncope. GI: Positive nausea. Positive vomiting. Positive diarrhea. No melena. No hematochezia. GU: No dysuria, no hematuria. ENDOCRINE: No polyuria, nocturia, or heat or cold intolerance. HEME: No anemia, no bruising, no bleeding. INTEGUMENT: No rashes. No lesions. MUSCULOSKELETAL: No arthritis, no swelling, no gout. NEUROLOGIC: No numbness, no tingling, no ataxia, no seizure-type activity. PSYCH: No anxiety, no insomnia, no ADD.   PAST MEDICAL HISTORY: Hypertension.   ALLERGIES: No  known drug allergies.   SOCIAL HISTORY: No smoking. No alcohol abuse. No illicit drug abuse. Lives in a group home.   FAMILY HISTORY: No significant family history of coronary artery disease or diabetes.   CURRENT MEDICATIONS: Based on her previous admission:  1. Aspirin 81 mg daily.  2. Lotrel 10/20, 1 tab daily.  3. Benadryl as needed.  4. Tramadol as needed.  I attempted to try to get her records from [facility] as she belongs to Dr. Marcello Fennel, but I was unable to access the records presently.   PHYSICAL EXAMINATION:  VITAL SIGNS: Temperature 98.4, pulse 92, respirations 22, blood pressure 123/46, sats 94% on room air.   GENERAL: She is a pleasant appearing female in no apparent distress.   HEENT: Atraumatic, normocephalic. Her extraocular muscles are intact. The pupils are equal and reactive to light. Sclerae anicteric. No conjunctival injection. No pharyngeal erythema.   NECK: Supple. No jugular venous distention. No bruits, no lymphadenopathy or thyromegaly.   HEART: Tachycardic, regular. No murmurs, no rubs, and no clicks.   LUNGS: Clear to auscultation bilaterally. No rales, no rhonchi, no wheezes.   ABDOMEN: Soft, slightly distended, hypoactive bowel sounds. Diffusely tender, but no rebound, no rigidity. No hepatosplenomegaly appreciated.   EXTREMITIES: No evidence of any cyanosis, clubbing, or peripheral edema. Has +2 pedal and radial pulses bilaterally.   NEUROLOGIC: The patient is alert, awake, and oriented times three with no focal motor or sensory deficits appreciated bilaterally.   SKIN: Moist and warm with no rash appreciated.  LYMPH: There is no cervical or axillary lymphadenopathy.   LABORATORY, DIAGNOSTIC, AND RADIOLOGICAL DATA:  Serum glucose 184, BUN 17,  creatinine 0.6, sodium 128, potassium 3.6, chloride 94, bicarbonate 25, magnesium is 1.4. LFTs are within normal limits. Troponin 0.07, TSH of 0.9, white cell count 4.6, hemoglobin 13.8, hematocrit 39.1,  platelet count 273, prothrombin time 13.3, INR 1. Lactic acid mildly elevated at 2.2.   The patient did have an abdomen 3-way, which shows nonobstructive bowel gas pattern, stool throughout the majority of the colon, mild retrocardiac opacity secondary to atelectasis. The patient had a CT of the abdomen and pelvis done with contrast showing mild amount of free fluid and stranding throughout the abdomen and pelvis that is nonspecific. There is an area of apparent wall thickening in the superior descending colon which is likely secondary to underdistention. However, given the adjacent free fluid an etiology such as colitis cannot be excluded. The patient also has a moderate size, low attenuation mass with some calcification in the left adnexa. This may be ovarian. In a postmenopausal patient differential would include benign versus malignant ovarian neoplasms. A nonemergent pelvic ultrasound and gynecologic consultation is suggested.   ASSESSMENT AND PLAN: This is a 81 year old female with a history of hypertension who presents to the hospital with abdominal pain, nausea, vomiting, and diarrhea. CT showing evidence of colitis.  1. Abdominal pain, nausea, and vomiting. The etiology currently is unclear but is suggestive of colitis given her clinical symptoms and the CT scan findings. Questionable if this infectious versus inflammatory. I will go ahead and check a stool for C. difficile and comprehensive culture.  I will also go ahead and empirically start her on ciprofloxacin and Flagyl and keep her n.p.o., treat her supportively with IV fluids, antiemetics, and pain control.  I will also go ahead and obtain a GI consult.  2. Elevated troponin. Likely this is in the setting of dehydration and colitis. I do not appreciate any evidence of acute coronary syndrome. She has no chest pain. EKG is suggestive of no acute ST changes. I will observe her on off-unit telemetry and cycle her cardiac markers. If she rules in I  will consider getting a cardiology consult.  3. Hypomagnesemia. This has been replaced in the Emergency Room and I will repeat magnesium level in the morning.  4. Hyponatremia. Also likely related to diarrhea and dehydration. We will continue IV fluids and repeat her sodium in the morning.  5. Hypertension. The patient cannot recall what antihypertensive she is currently on. She is currently normotensive and more on the dehydrated side. Therefore, I will hold her antihypertensives for now.  6. Pelvic adnexal mass. She likely will need a referral to GYN as an outpatient to get a pelvic ultrasound, but this can be done as an outpatient.   CODE STATUS: FULL CODE.  The patient will be transferred to Dr. Eston EstersHande's service.   TIME SPENT ON ADMISSION: 50 minutes.   ____________________________ Rolly PancakeVivek J. Cherlynn KaiserSainani, MD [id number] D: 10/29/2011 12:56:57 ET T: 10/29/2011 14:14:44 ET JOB#: [id number]  cc: Rolly PancakeVivek J. Cherlynn KaiserSainani, MD, <Dictator> Barbette ReichmannVishwanath Hande, MD Houston SirenVIVEK J SAINANI MD ELECTRONICALLY SIGNED 10/30/2011 20:25

## 2014-06-02 NOTE — Consult Note (Signed)
Brief Consult Note: Diagnosis: Abdominal pain, nausea and vomiting.   Patient was seen by consultant.   Comments: Admitted with 2-3 days of abdominal pain and vomiting. No diarrhea. Excessive bloating. Low grade fever of 100.7. Normal WBC. CT with normal appendix and small and large bowel with ? thickning of descending colon and small amount of fluid in that area. Positive ovarian lesion as well as cholelithiasis with mild biliary distension. Normal LFT's. Lactic acid somewhat elevated. Admitted with the diagnosis of colitis and is on antibiotics. Evaluation this morning showed distended abdomen. No bowel sounds and diffuse tenderness without rebound. Repeat WBC is still normal.  Impression: ? Mesenteric ischemia vs bowel obstruction. Other less likely possibilities are ovarian malignancy with mesenteric involvement.  Recommendations: Surgical consult. Message left with Dr. Molinda BailiffBird's nurse. NPO. Continue IV antibiotics. CA 125.  Electronic Signatures: Tina Orr, Tina Orr (MD)  (Signed 16-Sep-13 08:55)  Authored: Brief Consult Note   Last Updated: 16-Sep-13 08:55 by Tina Orr, Tina Orr (MD)
